# Patient Record
Sex: Female | Born: 1960 | Race: White | Hispanic: No | Marital: Married | Smoking: Former smoker
Health system: Southern US, Community
[De-identification: ages and names within clinical notes are randomized; demographics above are authoritative.]

## PROBLEM LIST (undated history)

## (undated) HISTORY — PX: FRACTURE SURGERY: SHX138

## (undated) HISTORY — PX: SHOULDER SURGERY: SHX246

## (undated) HISTORY — PX: WRIST FRACTURE SURGERY: SHX121

---

## 2020-07-10 ENCOUNTER — Telehealth: Payer: Self-pay

## 2020-07-10 NOTE — Telephone Encounter (Signed)
If her husband is my patient, we can schedule her.

## 2020-07-10 NOTE — Telephone Encounter (Signed)
Please review. New pts are on hold at this time, correct?

## 2020-07-10 NOTE — Telephone Encounter (Signed)
Copied from [place] [phone]. Topic: Appointment Scheduling - Scheduling Inquiry for Clinic >> Jul 10, 2020  3:41 PM [name] wrote: Reason for CRM: Pt was told to call in to sch a NEW PT appt with [facility] . Her husband sees her in the office and was told to just call to sch . Please reach out to pt   Est prim/UMR.callback [phone]/TMMITVI in office or virtual/pec

## 2020-07-15 NOTE — Telephone Encounter (Signed)
Patient's husband see Fabio Bering and she was scheduled for new patient appointment on 08/14/2020 @ 10:00 AM.

## 2020-08-14 ENCOUNTER — Ambulatory Visit: Payer: Self-pay | Admitting: Physician Assistant

## 2020-08-19 NOTE — Progress Notes (Addendum)
New patient visit   Patient: Stacy Wells   DOB: 27-Nov-1961   59 y.o. Female  MRN: [id number] Visit Date: 08/20/2020  Today's healthcare provider: Trinna Post, PA-C   Chief Complaint  Patient presents with  . New Patient (Initial Visit)  . Urinary Frequency   Subjective    Stacy Wells is a 59 y.o. female who presents today as a new patient to establish care. Moved here from [place]. Not currently working.   She reports that she has not had a PCP in several years. Her main complaint today is urinary frequency. She denies hematuria. She reports that she has had symptoms for months. Feels like she constantly has to empty. No stress or urge incontinence. No pelvic pain, vaginal discharge. No fevers, chills, abdominal pain. No history of kidney stones. History of one vaginal birth, ~ 6 lbs.   Colonoscopy ~ 5 years ago in [place] with Maryjane Hurter, she thinks it was normal.  Mammogram: 5 years ago in [place], reported normal.  PAP smear - does not recall   Elevated BP: Patient reports she does not have a history of high BP.   BP Readings from Last 5 Encounters:  08/20/20 (!) 156/72   She does currently vape but does not smoke cigarettes. She is a former smoker.    Constipation: Reports she has been constipated her whole life. May go a week or so without a bowel movement. Reports this will resolve when she drinks magnesium citrate. She denies personal or family history of colon cancer.  History reviewed. No pertinent past medical history. History reviewed. No pertinent surgical history. No family status information on file.   History reviewed. No pertinent family history. Social History   Socioeconomic History  . Marital status: Married    Spouse name: Not on file  . Number of children: Not on file  . Years of education: Not on file  . Highest education level: Not on file  Occupational History  . Not on file  Tobacco Use  . Smoking status: Current Every  Day Smoker  . Smokeless tobacco: Never Used  Substance and Sexual Activity  . Alcohol use: Yes  . Drug use: Never  . Sexual activity: Not on file  Other Topics Concern  . Not on file  Social History Narrative  . Not on file   Social Determinants of Health   Financial Resource Strain:   . Difficulty of Paying Living Expenses: Not on file  Food Insecurity:   . Worried About Charity fundraiser in the Last Year: Not on file  . Ran Out of Food in the Last Year: Not on file  Transportation Needs:   . Lack of Transportation (Medical): Not on file  . Lack of Transportation (Non-Medical): Not on file  Physical Activity:   . Days of Exercise per Week: Not on file  . Minutes of Exercise per Session: Not on file  Stress:   . Feeling of Stress : Not on file  Social Connections:   . Frequency of Communication with Friends and Family: Not on file  . Frequency of Social Gatherings with Friends and Family: Not on file  . Attends Religious Services: Not on file  . Active Member of Clubs or Organizations: Not on file  . Attends Archivist Meetings: Not on file  . Marital Status: Not on file   No outpatient medications prior to visit.   No facility-administered medications prior to visit.   Not on File  Immunization History  Administered Date(s) Administered  . Influenza,inj,Quad PF,6+ Mos 08/20/2020    Health Maintenance  Topic Date Due  . Hepatitis C Screening  Never done  . COVID-19 Vaccine (1) Never done  . HIV Screening  Never done  . TETANUS/TDAP  Never done  . PAP SMEAR-Modifier  Never done  . MAMMOGRAM  Never done  . COLONOSCOPY  Never done  . INFLUENZA VACCINE  Completed    Patient Care Team: Paulene Floor as PCP - General (Physician Assistant)  Review of Systems  Constitutional: Negative.   HENT: Negative.   Eyes: Negative.   Respiratory: Negative.   Cardiovascular: Negative.   Gastrointestinal: Negative.   Endocrine: Negative.     Genitourinary: Positive for frequency. Negative for hematuria.  Musculoskeletal: Negative.   Skin: Negative.   Allergic/Immunologic: Negative.   Neurological: Negative.   Hematological: Negative.   Psychiatric/Behavioral: Negative.       Objective    BP (!) 156/72   Pulse 73   Temp 98.6 F (37 C)   Ht 5\' 6"  (1.676 m)   Wt 108 lb 9.6 oz (49.3 kg)   BMI 17.53 kg/m  Physical Exam Constitutional:      Appearance: Normal appearance.  Cardiovascular:     Rate and Rhythm: Normal rate and regular rhythm.     Heart sounds: Normal heart sounds.  Pulmonary:     Effort: Pulmonary effort is normal.     Breath sounds: Normal breath sounds.  Abdominal:     General: Bowel sounds are normal.     Palpations: Abdomen is soft.  Skin:    General: Skin is warm and dry.  Neurological:     Mental Status: She is alert and oriented to person, place, and time. Mental status is at baseline.  Psychiatric:        Mood and Affect: Mood normal.        Behavior: Behavior normal.      Depression Screen PHQ 2/9 Scores 08/20/2020  PHQ - 2 Score 0   No results found for any visits on 08/20/20.  Assessment & Plan     1. Urinary frequency  Trial of ditropan, counseled on side effects. Urine culture shows E. Coli. Will treat with bactrim DS BID x 3 days.   - Urine Microscopic - CULTURE, URINE COMPREHENSIVE - oxybutynin (DITROPAN XL) 5 MG 24 hr tablet; Take 1 tablet (5 mg total) by mouth at bedtime.  Dispense: 30 tablet; Refill: 2  2. Colon cancer screening  Requesting colonoscopy from Maryjane Hurter in [place].  3. Encounter for screening mammogram for malignant neoplasm of breast  - MM Digital Screening; Future  4. Elevated blood pressure reading  Recheck next visit.   - TSH - Lipid panel - Comprehensive metabolic panel - CBC with Differential/Platelet  5. Encounter for screening for HIV  - HIV Antibody (routine testing w rflx)  6. Encounter for hepatitis C screening test  for low risk patient  - Hepatitis C Antibody  7. Need for influenza vaccination  - Flu Vaccine QUAD 6+ mos PF IM (Fluarix Quad PF)  8. Constipation   Can use miralax daily.   Return in about 4 weeks (around 09/17/2020) for CPE.     ITrinna Post, PA-C, have reviewed all documentation for this visit. The documentation on 08/20/20 for the exam, diagnosis, procedures, and orders are all accurate and complete.  The entirety of the information documented in the History of Present Illness, Review of Systems  and Physical Exam were personally obtained by me. Portions of this information were initially documented by Meyer Cory, CMA and reviewed by me for thoroughness and accuracy.      Paulene Floor  [facility] [phone] (phone) [phone] (fax)  [place]

## 2020-08-20 ENCOUNTER — Encounter: Payer: Self-pay | Admitting: Physician Assistant

## 2020-08-20 ENCOUNTER — Ambulatory Visit (INDEPENDENT_AMBULATORY_CARE_PROVIDER_SITE_OTHER): Payer: Commercial Managed Care - PPO | Admitting: Physician Assistant

## 2020-08-20 ENCOUNTER — Other Ambulatory Visit: Payer: Self-pay

## 2020-08-20 VITALS — BP 156/72 | HR 73 | Temp 98.6°F | Ht 66.0 in | Wt 108.6 lb

## 2020-08-20 DIAGNOSIS — Z23 Encounter for immunization: Secondary | ICD-10-CM | POA: Diagnosis not present

## 2020-08-20 DIAGNOSIS — R350 Frequency of micturition: Secondary | ICD-10-CM | POA: Diagnosis not present

## 2020-08-20 DIAGNOSIS — Z1211 Encounter for screening for malignant neoplasm of colon: Secondary | ICD-10-CM

## 2020-08-20 DIAGNOSIS — R030 Elevated blood-pressure reading, without diagnosis of hypertension: Secondary | ICD-10-CM

## 2020-08-20 DIAGNOSIS — K5900 Constipation, unspecified: Secondary | ICD-10-CM

## 2020-08-20 DIAGNOSIS — Z1231 Encounter for screening mammogram for malignant neoplasm of breast: Secondary | ICD-10-CM

## 2020-08-20 DIAGNOSIS — Z1159 Encounter for screening for other viral diseases: Secondary | ICD-10-CM

## 2020-08-20 DIAGNOSIS — Z114 Encounter for screening for human immunodeficiency virus [HIV]: Secondary | ICD-10-CM

## 2020-08-20 MED ORDER — OXYBUTYNIN CHLORIDE ER 5 MG PO TB24: 5.0000 mg | ORAL_TABLET | Freq: Every day | ORAL | 2 refills | Status: AC

## 2020-08-20 NOTE — Patient Instructions (Signed)
Shingrix - two dose shingles vaccine. Call your insurance and see where they pay for it.

## 2020-08-21 LAB — CBC WITH DIFFERENTIAL/PLATELET
Basophils Absolute: 0 10*3/uL (ref 0.0–0.2)
Basos: 1 %
EOS (ABSOLUTE): 0 10*3/uL (ref 0.0–0.4)
Eos: 0 %
Hematocrit: 35.2 % (ref 34.0–46.6)
Hemoglobin: 12 g/dL (ref 11.1–15.9)
Immature Grans (Abs): 0 10*3/uL (ref 0.0–0.1)
Immature Granulocytes: 0 %
Lymphocytes Absolute: 1.1 10*3/uL (ref 0.7–3.1)
Lymphs: 22 %
MCH: 33.9 pg — ABNORMAL HIGH (ref 26.6–33.0)
MCHC: 34.1 g/dL (ref 31.5–35.7)
MCV: 99 fL — ABNORMAL HIGH (ref 79–97)
Monocytes Absolute: 0.4 10*3/uL (ref 0.1–0.9)
Monocytes: 7 %
Neutrophils Absolute: 3.5 10*3/uL (ref 1.4–7.0)
Neutrophils: 70 %
Platelets: 300 10*3/uL (ref 150–450)
RBC: 3.54 x10E6/uL — ABNORMAL LOW (ref 3.77–5.28)
RDW: 12.3 % (ref 11.7–15.4)
WBC: 5.1 10*3/uL (ref 3.4–10.8)

## 2020-08-21 LAB — COMPREHENSIVE METABOLIC PANEL
ALT: 11 IU/L (ref 0–32)
AST: 15 IU/L (ref 0–40)
Albumin/Globulin Ratio: 1.8 (ref 1.2–2.2)
Albumin: 4.8 g/dL (ref 3.8–4.9)
Alkaline Phosphatase: 70 IU/L (ref 48–121)
BUN/Creatinine Ratio: 19 (ref 12–28)
BUN: 11 mg/dL (ref 8–27)
Bilirubin Total: 0.2 mg/dL (ref 0.0–1.2)
CO2: 24 mmol/L (ref 20–29)
Calcium: 9.8 mg/dL (ref 8.7–10.3)
Chloride: 101 mmol/L (ref 96–106)
Creatinine, Ser: 0.57 mg/dL (ref 0.57–1.00)
GFR calc Af Amer: 117 mL/min/{1.73_m2} (ref >59)
GFR calc non Af Amer: 101 mL/min/{1.73_m2} (ref >59)
Globulin, Total: 2.6 g/dL (ref 1.5–4.5)
Glucose: 87 mg/dL (ref 65–99)
Potassium: 4.2 mmol/L (ref 3.5–5.2)
Sodium: 140 mmol/L (ref 134–144)
Total Protein: 7.4 g/dL (ref 6.0–8.5)

## 2020-08-21 LAB — TSH: TSH: 1.34 u[IU]/mL (ref 0.450–4.500)

## 2020-08-21 LAB — LIPID PANEL
Chol/HDL Ratio: 3.3 ratio (ref 0.0–4.4)
Cholesterol, Total: 273 mg/dL — ABNORMAL HIGH (ref 100–199)
HDL: 83 mg/dL (ref >39)
LDL Chol Calc (NIH): 173 mg/dL — ABNORMAL HIGH (ref 0–99)
Triglycerides: 101 mg/dL (ref 0–149)
VLDL Cholesterol Cal: 17 mg/dL (ref 5–40)

## 2020-08-21 LAB — URINALYSIS, MICROSCOPIC ONLY
Casts: NONE SEEN /lpf
Epithelial Cells (non renal): NONE SEEN /hpf (ref 0–10)
RBC, Urine: NONE SEEN /hpf (ref 0–2)
WBC, UA: NONE SEEN /hpf (ref 0–5)

## 2020-08-21 LAB — HIV ANTIBODY (ROUTINE TESTING W REFLEX): HIV Screen 4th Generation wRfx: NONREACTIVE

## 2020-08-21 LAB — HEPATITIS C ANTIBODY: Hep C Virus Ab: <0.1 s/co ratio (ref 0.0–0.9)

## 2020-08-23 ENCOUNTER — Telehealth: Payer: Self-pay

## 2020-08-23 DIAGNOSIS — N3090 Cystitis, unspecified without hematuria: Secondary | ICD-10-CM

## 2020-08-23 LAB — CULTURE, URINE COMPREHENSIVE

## 2020-08-23 NOTE — Telephone Encounter (Signed)
Called patient and no answer left voicemail message for patient to return call. If patient calls back okay for PEC to advise of message.

## 2020-08-23 NOTE — Telephone Encounter (Signed)
-----   Message from Trinna Post, [place] sent at 08/23/2020  2:10 PM EDT ----- Urine culture shows e coli. This is a urinary tract infection that can contribute to urinary frequency. I would recommend bactrim DS twice daily for three days. Can we ask patient which pharmacy she needs it sent to because I believe she is out of town. OK to send to pharmacy of her choice.

## 2020-08-28 MED ORDER — SULFAMETHOXAZOLE-TRIMETHOPRIM 800-160 MG PO TABS: 1.0000 | ORAL_TABLET | Freq: Two times a day (BID) | ORAL | 0 refills | Status: DC

## 2020-08-28 NOTE — Telephone Encounter (Signed)
Patient was advised and report she still has the urinating frequently. Patient states the Bactrim can be send to pharmacy in chart.

## 2020-08-28 NOTE — Telephone Encounter (Signed)
Sent her in some bactrim to local pharmacy.

## 2020-08-29 MED ORDER — SULFAMETHOXAZOLE-TRIMETHOPRIM 800-160 MG PO TABS: 1.0000 | ORAL_TABLET | Freq: Two times a day (BID) | ORAL | 0 refills | Status: DC

## 2020-08-29 MED ORDER — HYDROXYZINE HCL 10 MG PO TABS: 10.0000 mg | ORAL_TABLET | Freq: Three times a day (TID) | ORAL | 0 refills | Status: DC | PRN

## 2020-08-29 MED ORDER — SULFAMETHOXAZOLE-TRIMETHOPRIM 800-160 MG PO TABS: 1.0000 | ORAL_TABLET | Freq: Two times a day (BID) | ORAL | 0 refills | Status: AC

## 2020-08-29 NOTE — Telephone Encounter (Signed)
Patient called to inform the doctor that she is in [place] and she would like the medication for antibiotic sent to a pharmacy there.  She will not be back in town until next week.  Please call patient to discuss at [phone]

## 2020-08-29 NOTE — Telephone Encounter (Signed)
Spoke to patient and she is requesting abx be sent into [facility] in [place], [place]. Thanks!

## 2020-08-29 NOTE — Addendum Note (Signed)
Addended by: Trinna Post on: 08/29/2020 04:06 PM   Modules accepted: Orders

## 2020-08-29 NOTE — Telephone Encounter (Signed)
Patient was advised and said "thank you so much" 

## 2020-08-29 NOTE — Telephone Encounter (Signed)
Sent in Bactrim to her [facility]. Ignore the hydroxyzine, I sent it in by accident. Can we call the pharmacy and cancel subscription?

## 2020-09-17 ENCOUNTER — Other Ambulatory Visit: Payer: Self-pay

## 2020-09-17 ENCOUNTER — Ambulatory Visit
Admission: RE | Admit: 2020-09-17 | Discharge: 2020-09-17 | Disposition: A | Discharge status: Home / Self Care | Payer: Commercial Managed Care - PPO | Source: Ambulatory Visit | Attending: Physician Assistant | Admitting: Physician Assistant

## 2020-09-17 DIAGNOSIS — Z1231 Encounter for screening mammogram for malignant neoplasm of breast: Secondary | ICD-10-CM | POA: Diagnosis present

## 2020-10-08 ENCOUNTER — Ambulatory Visit (INDEPENDENT_AMBULATORY_CARE_PROVIDER_SITE_OTHER): Payer: Commercial Managed Care - PPO | Admitting: Physician Assistant

## 2020-10-08 ENCOUNTER — Other Ambulatory Visit: Payer: Self-pay

## 2020-10-08 ENCOUNTER — Encounter: Payer: Self-pay | Admitting: Physician Assistant

## 2020-10-08 ENCOUNTER — Other Ambulatory Visit (HOSPITAL_COMMUNITY)
Admission: RE | Admit: 2020-10-08 | Discharge: 2020-10-08 | Disposition: A | Discharge status: Home / Self Care | Payer: Commercial Managed Care - PPO | Source: Ambulatory Visit | Attending: Physician Assistant | Admitting: Physician Assistant

## 2020-10-08 VITALS — BP 134/70 | HR 75 | Temp 98.1°F | Ht 66.0 in | Wt 107.0 lb

## 2020-10-08 DIAGNOSIS — N3281 Overactive bladder: Secondary | ICD-10-CM

## 2020-10-08 DIAGNOSIS — Z0000 Encounter for general adult medical examination without abnormal findings: Secondary | ICD-10-CM

## 2020-10-08 DIAGNOSIS — E785 Hyperlipidemia, unspecified: Secondary | ICD-10-CM

## 2020-10-08 DIAGNOSIS — Z86010 Personal history of colonic polyps: Secondary | ICD-10-CM

## 2020-10-08 DIAGNOSIS — Z1211 Encounter for screening for malignant neoplasm of colon: Secondary | ICD-10-CM

## 2020-10-08 DIAGNOSIS — Z124 Encounter for screening for malignant neoplasm of cervix: Secondary | ICD-10-CM | POA: Diagnosis not present

## 2020-10-08 DIAGNOSIS — Z23 Encounter for immunization: Secondary | ICD-10-CM

## 2020-10-08 MED ORDER — ROSUVASTATIN CALCIUM 5 MG PO TABS: 5.0000 mg | ORAL_TABLET | Freq: Every day | ORAL | 1 refills | Status: DC

## 2020-10-08 MED ORDER — OXYBUTYNIN CHLORIDE ER 5 MG PO TB24: 5.0000 mg | ORAL_TABLET | Freq: Every day | ORAL | 0 refills | Status: DC

## 2020-10-08 NOTE — Progress Notes (Signed)
Complete physical exam   Patient: Stacy Wells   DOB: February 21, 1961   59 y.o. Female  MRN: [id number] Visit Date: 10/08/2020  Today's healthcare provider: Trinna Post, PA-C   Chief Complaint  Patient presents with  . Annual Exam   Subjective    Stacy Wells is a 59 y.o. female who presents today for a complete physical exam.  She reports consuming a general diet. Home exercise routine includes walking. She generally feels well. She reports sleeping well. She does not have additional problems to discuss today.  HPI   Lipid/Cholesterol, Follow-up  Last lipid panel Other pertinent labs  Lab Results  Component Value Date   CHOL 273 (H) 08/20/2020   HDL 83 08/20/2020   LDLCALC 173 (H) 08/20/2020   TRIG 101 08/20/2020   CHOLHDL 3.3 08/20/2020   Lab Results  Component Value Date   ALT 11 08/20/2020   AST 15 08/20/2020   PLT 300 08/20/2020   TSH 1.340 08/20/2020     She was last seen for this 1 months ago.  Management since that visit includes checking labs.  Symptoms: No chest pain No chest pressure/discomfort  No dyspnea No lower extremity edema  No numbness or tingling of extremity No orthopnea  No palpitations No paroxysmal nocturnal dyspnea  No speech difficulty No syncope   Current diet: not asked Current exercise: aerobics  The 10-year ASCVD risk score Mikey Bussing DC Jr., et al., 2013) is: 7.9%  ---------------------------------------------------------------------------------------------------  History reviewed. No pertinent past medical history. History reviewed. No pertinent surgical history. Social History   Socioeconomic History  . Marital status: Married    Spouse name: Not on file  . Number of children: Not on file  . Years of education: Not on file  . Highest education level: Not on file  Occupational History  . Not on file  Tobacco Use  . Smoking status: Current Every Day Smoker  . Smokeless tobacco: Never Used  Substance and  Sexual Activity  . Alcohol use: Yes  . Drug use: Never  . Sexual activity: Not on file  Other Topics Concern  . Not on file  Social History Narrative  . Not on file   Social Determinants of Health   Financial Resource Strain:   . Difficulty of Paying Living Expenses: Not on file  Food Insecurity:   . Worried About Charity fundraiser in the Last Year: Not on file  . Ran Out of Food in the Last Year: Not on file  Transportation Needs:   . Lack of Transportation (Medical): Not on file  . Lack of Transportation (Non-Medical): Not on file  Physical Activity:   . Days of Exercise per Week: Not on file  . Minutes of Exercise per Session: Not on file  Stress:   . Feeling of Stress : Not on file  Social Connections:   . Frequency of Communication with Friends and Family: Not on file  . Frequency of Social Gatherings with Friends and Family: Not on file  . Attends Religious Services: Not on file  . Active Member of Clubs or Organizations: Not on file  . Attends Archivist Meetings: Not on file  . Marital Status: Not on file  Intimate Partner Violence:   . Fear of Current or Ex-Partner: Not on file  . Emotionally Abused: Not on file  . Physically Abused: Not on file  . Sexually Abused: Not on file   No family status information on file.  History reviewed. No pertinent family history. Allergies  Allergen Reactions  . Codeine Rash    Patient Care Team: Paulene Floor as PCP - General (Physician Assistant)   Medications: No outpatient medications prior to visit.   No facility-administered medications prior to visit.    Review of Systems  Constitutional: Negative.   HENT: Negative.   Eyes: Negative.   Respiratory: Negative.   Cardiovascular: Negative.   Gastrointestinal: Negative.   Endocrine: Negative.   Genitourinary: Positive for frequency.  Musculoskeletal: Negative.   Skin: Negative.   Allergic/Immunologic: Negative.   Neurological:  Negative.   Hematological: Negative.   Psychiatric/Behavioral: Negative.       Objective    BP 134/70 (BP Location: Left Arm, Patient Position: Sitting, Cuff Size: Normal)   Pulse 75   Temp 98.1 F (36.7 C) (Oral)   Ht 5\' 6"  (1.676 m)   Wt 107 lb (48.5 kg)   SpO2 98%   BMI 17.27 kg/m    Physical Exam Exam conducted with a chaperone present.  Constitutional:      Appearance: Normal appearance.  HENT:     Right Ear: Tympanic membrane and ear canal normal.     Left Ear: Tympanic membrane and ear canal normal.  Cardiovascular:     Rate and Rhythm: Normal rate and regular rhythm.     Heart sounds: Normal heart sounds.  Pulmonary:     Effort: Pulmonary effort is normal.     Breath sounds: Normal breath sounds.  Abdominal:     General: Bowel sounds are normal.     Palpations: Abdomen is soft.  Genitourinary:    Vagina: Normal.     Cervix: Normal.  Musculoskeletal:     Cervical back: Normal range of motion and neck supple.  Lymphadenopathy:     Cervical: No cervical adenopathy.  Skin:    General: Skin is warm and dry.  Neurological:     Mental Status: She is alert and oriented to person, place, and time. Mental status is at baseline.  Psychiatric:        Mood and Affect: Mood normal.        Behavior: Behavior normal.       Last depression screening scores PHQ 2/9 Scores 10/08/2020 08/20/2020  PHQ - 2 Score 0 0   Last fall risk screening Fall Risk  10/08/2020  Falls in the past year? 0  Number falls in past yr: 0  Injury with Fall? 0   Last Audit-C alcohol use screening Alcohol Use Disorder Test (AUDIT) 08/20/2020  1. How often do you have a drink containing alcohol? 4  2. How many drinks containing alcohol do you have on a typical day when you are drinking? 1  3. How often do you have six or more drinks on one occasion? 2  AUDIT-C Score 7  4. How often during the last year have you found that you were not able to stop drinking once you had started? 0  5.  How often during the last year have you failed to do what was normally expected from you because of drinking? 0  6. How often during the last year have you needed a first drink in the morning to get yourself going after a heavy drinking session? 0  7. How often during the last year have you had a feeling of guilt of remorse after drinking? 0  8. How often during the last year have you been unable to remember what happened the night before  because you had been drinking? 0  9. Have you or someone else been injured as a result of your drinking? 0  10. Has a relative or friend or a doctor or another health worker been concerned about your drinking or suggested you cut down? 0  Alcohol Use Disorder Identification Test Final Score (AUDIT) 7  Alcohol Brief Interventions/Follow-up AUDIT Score <7 follow-up not indicated   A score of 3 or more in women, and 4 or more in men indicates increased risk for alcohol abuse, EXCEPT if all of the points are from question 1   Results for orders placed or performed in visit on 10/08/20  Cytology - PAP  Result Value Ref Range   High risk HPV Negative    Adequacy      Satisfactory for evaluation; transformation zone component PRESENT.   Diagnosis      - Negative for intraepithelial lesion or malignancy (NILM)   Comment Normal Reference Range HPV - Negative     Assessment & Plan    Routine Health Maintenance and Physical Exam  Exercise Activities and Dietary recommendations Goals   None     Immunization History  Administered Date(s) Administered  . Influenza,inj,Quad PF,6+ Mos 08/20/2020  . PFIZER SARS-COV-2 Vaccination 03/20/2020, 04/10/2020  . Tdap 10/08/2020  . Zoster Recombinat (Shingrix) 10/08/2020    Health Maintenance  Topic Date Due  . COLONOSCOPY  Never done  . MAMMOGRAM  09/17/2022  . PAP SMEAR-Modifier  10/09/2023  . TETANUS/TDAP  10/08/2030  . INFLUENZA VACCINE  Completed  . COVID-19 Vaccine  Completed  . Hepatitis C Screening   Completed  . HIV Screening  Completed    Discussed health benefits of physical activity, and encouraged her to engage in regular exercise appropriate for her age and condition.   1. Annual physical exam  She is going to be without insurance at some point during the next year and is considering moving back to [place]. Counseled on our self pay discount and/or community resources including free and low cost clinics. Advised she should be seen at least 1x per year for routine follow up.  2. Cervical cancer screening  - Cytology - PAP  3. Need for shingles vaccine  - Varicella-zoster vaccine IM (Shingrix)  4. Need for tetanus booster  - Tdap vaccine greater than or equal to 7yo IM  5. OAB (overactive bladder)  She did have treatment for UTI but her symptoms have not resolved. Trial as below.  - oxybutynin (DITROPAN XL) 5 MG 24 hr tablet; Take 1 tablet (5 mg total) by mouth at bedtime.  Dispense: 90 tablet; Refill: 0  6. Colon cancer screening  - Ambulatory referral to Gastroenterology  7. Hyperlipidemia, unspecified hyperlipidemia type  Start statin for elevated CVD risk. F/u 6 weeks. - rosuvastatin (CRESTOR) 5 MG tablet; Take 1 tablet (5 mg total) by mouth daily.  Dispense: 90 tablet; Refill: 1  8. History of colon polyps  - Ambulatory referral to Gastroenterology   No follow-ups on file.     .providerayy  The entirety of the information documented in the History of Present Illness, Review of Systems and Physical Exam were personally obtained by me. Portions of this information were initially documented by Idelle Jo, CMA and reviewed by me for thoroughness and accuracy.        Paulene Floor  [facility] [phone] (phone) [phone] (fax)  [place]

## 2020-10-08 NOTE — Patient Instructions (Addendum)
Take allegra/zyrtec/claritin or xyzal daily Add flonase 2 sprays each nostril daily if not helping    Health Maintenance, Female Adopting a healthy lifestyle and getting preventive care are important in promoting health and wellness. Ask your health care provider about:  The right schedule for you to have regular tests and exams.  Things you can do on your own to prevent diseases and keep yourself healthy. What should I know about diet, weight, and exercise? Eat a healthy diet   Eat a diet that includes plenty of vegetables, fruits, low-fat dairy products, and lean protein.  Do not eat a lot of foods that are high in solid fats, added sugars, or sodium. Maintain a healthy weight Body mass index (BMI) is used to identify weight problems. It estimates body fat based on height and weight. Your health care provider can help determine your BMI and help you achieve or maintain a healthy weight. Get regular exercise Get regular exercise. This is one of the most important things you can do for your health. Most adults should:  Exercise for at least 150 minutes each week. The exercise should increase your heart rate and make you sweat (moderate-intensity exercise).  Do strengthening exercises at least twice a week. This is in addition to the moderate-intensity exercise.  Spend less time sitting. Even light physical activity can be beneficial. Watch cholesterol and blood lipids Have your blood tested for lipids and cholesterol at 61 years of age, then have this test every 5 years. Have your cholesterol levels checked more often if:  Your lipid or cholesterol levels are high.  You are older than 61 years of age.  You are at high risk for heart disease. What should I know about cancer screening? Depending on your health history and family history, you may need to have cancer screening at various ages. This may include screening for:  Breast cancer.  Cervical cancer.  Colorectal  cancer.  Skin cancer.  Lung cancer. What should I know about heart disease, diabetes, and high blood pressure? Blood pressure and heart disease  High blood pressure causes heart disease and increases the risk of stroke. This is more likely to develop in people who have high blood pressure readings, are of African descent, or are overweight.  Have your blood pressure checked: ? Every 3-5 years if you are 70-10 years of age. ? Every year if you are 3 years old or older. Diabetes Have regular diabetes screenings. This checks your fasting blood sugar level. Have the screening done:  Once every three years after age 27 if you are at a normal weight and have a low risk for diabetes.  More often and at a younger age if you are overweight or have a high risk for diabetes. What should I know about preventing infection? Hepatitis B If you have a higher risk for hepatitis B, you should be screened for this virus. Talk with your health care provider to find out if you are at risk for hepatitis B infection. Hepatitis C Testing is recommended for:  Everyone born from [birth year] through 1967.  Anyone with known risk factors for hepatitis C. Sexually transmitted infections (STIs)  Get screened for STIs, including gonorrhea and chlamydia, if: ? You are sexually active and are younger than 61 years of age. ? You are older than 61 years of age and your health care provider tells you that you are at risk for this type of infection. ? Your sexual activity has changed since you were  last screened, and you are at increased risk for chlamydia or gonorrhea. Ask your health care provider if you are at risk.  Ask your health care provider about whether you are at high risk for HIV. Your health care provider may recommend a prescription medicine to help prevent HIV infection. If you choose to take medicine to prevent HIV, you should first get tested for HIV. You should then be tested every 3 months for as long as  you are taking the medicine. Pregnancy  If you are about to stop having your period (premenopausal) and you may become pregnant, seek counseling before you get pregnant.  Take 400 to 800 micrograms (mcg) of folic acid every day if you become pregnant.  Ask for birth control (contraception) if you want to prevent pregnancy. Osteoporosis and menopause Osteoporosis is a disease in which the bones lose minerals and strength with aging. This can result in bone fractures. If you are 57 years old or older, or if you are at risk for osteoporosis and fractures, ask your health care provider if you should:  Be screened for bone loss.  Take a calcium or vitamin D supplement to lower your risk of fractures.  Be given hormone replacement therapy (HRT) to treat symptoms of menopause. Follow these instructions at home: Lifestyle  Do not use any products that contain nicotine or tobacco, such as cigarettes, e-cigarettes, and chewing tobacco. If you need help quitting, ask your health care provider.  Do not use street drugs.  Do not share needles.  Ask your health care provider for help if you need support or information about quitting drugs. Alcohol use  Do not drink alcohol if: ? Your health care provider tells you not to drink. ? You are pregnant, may be pregnant, or are planning to become pregnant.  If you drink alcohol: ? Limit how much you use to 0-1 drink a day. ? Limit intake if you are breastfeeding.  Be aware of how much alcohol is in your drink. In the [place] one drink equals one 12 oz bottle of beer (355 mL), one 5 oz glass of wine (148 mL), or one 1 oz glass of hard liquor (44 mL). General instructions  Schedule regular health, dental, and eye exams.  Stay current with your vaccines.  Tell your health care provider if: ? You often feel depressed. ? You have ever been abused or do not feel safe at home. Summary  Adopting a healthy lifestyle and getting preventive care are  important in promoting health and wellness.  Follow your health care provider's instructions about healthy diet, exercising, and getting tested or screened for diseases.  Follow your health care provider's instructions on monitoring your cholesterol and blood pressure. This information is not intended to replace advice given to you by your health care provider. Make sure you discuss any questions you have with your health care provider. Document Revised: 11/30/2018 Document Reviewed: 11/30/2018 Elsevier Patient Education  2020 [employer].

## 2020-10-09 ENCOUNTER — Encounter: Payer: Self-pay | Admitting: Physician Assistant

## 2020-10-10 LAB — CYTOLOGY - PAP
Comment: NEGATIVE
Diagnosis: NEGATIVE
High risk HPV: NEGATIVE

## 2020-10-17 ENCOUNTER — Telehealth (INDEPENDENT_AMBULATORY_CARE_PROVIDER_SITE_OTHER): Payer: Self-pay | Admitting: Gastroenterology

## 2020-10-17 DIAGNOSIS — Z86010 Personal history of colonic polyps: Secondary | ICD-10-CM

## 2020-10-17 DIAGNOSIS — Z1211 Encounter for screening for malignant neoplasm of colon: Secondary | ICD-10-CM

## 2020-10-17 MED ORDER — NA SULFATE-K SULFATE-MG SULF 17.5-3.13-1.6 GM/177ML PO SOLN: 1.0000 | Freq: Once | ORAL | 0 refills | Status: AC

## 2020-10-17 NOTE — Progress Notes (Signed)
Gastroenterology Pre-Procedure Review  Request Date: Tuesday 10/22/20 Requesting Physician: Dr. Vicente Males  PATIENT REVIEW QUESTIONS: The patient responded to the following health history questions as indicated:    1. Are you having any GI issues? no 2. Do you have a personal history of Polyps? yes (doesn't recall the year) 3. Do you have a family history of Colon Cancer or Polyps? no 4. Diabetes Mellitus? no 5. Joint replacements in the past 12 months?no 6. Major health problems in the past 3 months?no 7. Any artificial heart valves, MVP, or defibrillator?no    MEDICATIONS & ALLERGIES:    Patient reports the following regarding taking any anticoagulation/antiplatelet therapy:   Plavix, Coumadin, Eliquis, Xarelto, Lovenox, Pradaxa, Brilinta, or Effient? no Aspirin? no  Patient confirms/reports the following medications:  Current Outpatient Medications  Medication Sig Dispense Refill  . oxybutynin (DITROPAN XL) 5 MG 24 hr tablet Take 1 tablet (5 mg total) by mouth at bedtime. 90 tablet 0  . rosuvastatin (CRESTOR) 5 MG tablet Take 1 tablet (5 mg total) by mouth daily. 90 tablet 1   No current facility-administered medications for this visit.    Patient confirms/reports the following allergies:  Allergies  Allergen Reactions  . Codeine Rash    No orders of the defined types were placed in this encounter.   AUTHORIZATION INFORMATION Primary Insurance: 1D#: Group #:  Secondary Insurance: 1D#: Group #:  SCHEDULE INFORMATION: Date: 10/22/20 Time: Location:[facility]

## 2020-10-18 ENCOUNTER — Other Ambulatory Visit
Admission: RE | Admit: 2020-10-18 | Discharge: 2020-10-18 | Disposition: A | Discharge status: Home / Self Care | Payer: Commercial Managed Care - PPO | Source: Ambulatory Visit | Attending: Gastroenterology | Admitting: Gastroenterology

## 2020-10-18 ENCOUNTER — Other Ambulatory Visit: Payer: Self-pay

## 2020-10-18 DIAGNOSIS — Z01812 Encounter for preprocedural laboratory examination: Secondary | ICD-10-CM | POA: Diagnosis present

## 2020-10-18 DIAGNOSIS — Z20822 Contact with and (suspected) exposure to covid-19: Secondary | ICD-10-CM | POA: Insufficient documentation

## 2020-10-19 LAB — SARS CORONAVIRUS 2 (TAT 6-24 HRS): SARS Coronavirus 2: NEGATIVE

## 2020-10-22 ENCOUNTER — Encounter
Admission: RE | Disposition: A | Discharge status: Home / Self Care | Payer: Self-pay | Source: Home / Self Care | Attending: Gastroenterology

## 2020-10-22 ENCOUNTER — Ambulatory Visit
Admission: RE | Admit: 2020-10-22 | Discharge: 2020-10-22 | Disposition: A | Discharge status: Home / Self Care | Payer: Commercial Managed Care - PPO | Attending: Gastroenterology | Admitting: Gastroenterology

## 2020-10-22 ENCOUNTER — Ambulatory Visit: Payer: Commercial Managed Care - PPO | Admitting: Certified Registered"

## 2020-10-22 ENCOUNTER — Encounter: Payer: Self-pay | Admitting: Gastroenterology

## 2020-10-22 ENCOUNTER — Other Ambulatory Visit: Payer: Self-pay

## 2020-10-22 DIAGNOSIS — Z1211 Encounter for screening for malignant neoplasm of colon: Secondary | ICD-10-CM | POA: Insufficient documentation

## 2020-10-22 DIAGNOSIS — D122 Benign neoplasm of ascending colon: Secondary | ICD-10-CM | POA: Diagnosis not present

## 2020-10-22 DIAGNOSIS — Z87891 Personal history of nicotine dependence: Secondary | ICD-10-CM | POA: Insufficient documentation

## 2020-10-22 DIAGNOSIS — Z86010 Personal history of colonic polyps: Secondary | ICD-10-CM | POA: Insufficient documentation

## 2020-10-22 DIAGNOSIS — D124 Benign neoplasm of descending colon: Secondary | ICD-10-CM | POA: Insufficient documentation

## 2020-10-22 DIAGNOSIS — K635 Polyp of colon: Secondary | ICD-10-CM

## 2020-10-22 DIAGNOSIS — Z79899 Other long term (current) drug therapy: Secondary | ICD-10-CM | POA: Diagnosis not present

## 2020-10-22 HISTORY — PX: COLONOSCOPY WITH PROPOFOL: SHX5780

## 2020-10-22 SURGERY — COLONOSCOPY WITH PROPOFOL
Anesthesia: General

## 2020-10-22 MED ORDER — SODIUM CHLORIDE 0.9 % IV SOLN
INTRAVENOUS | Status: DC
  Administered 2020-10-22: 1000 mL via INTRAVENOUS

## 2020-10-22 MED ORDER — PROPOFOL 10 MG/ML IV BOLUS
INTRAVENOUS | Status: DC | PRN
  Administered 2020-10-22: 20 mg via INTRAVENOUS
  Administered 2020-10-22: 60 mg via INTRAVENOUS
  Administered 2020-10-22: 20 mg via INTRAVENOUS

## 2020-10-22 MED ORDER — PHENYLEPHRINE HCL (PRESSORS) 10 MG/ML IV SOLN
INTRAVENOUS | Status: DC | PRN
  Administered 2020-10-22: 100 ug via INTRAVENOUS

## 2020-10-22 MED ORDER — PROPOFOL 500 MG/50ML IV EMUL
INTRAVENOUS | Status: DC | PRN
  Administered 2020-10-22: 125 ug/kg/min via INTRAVENOUS

## 2020-10-22 MED ORDER — PROPOFOL 500 MG/50ML IV EMUL
INTRAVENOUS | Status: AC
  Filled 2020-10-22: qty 50

## 2020-10-22 NOTE — Transfer of Care (Signed)
Immediate Anesthesia Transfer of Care Note  Patient: Stacy Wells  Procedure(s) Performed: COLONOSCOPY WITH PROPOFOL (N/A )  Patient Location: PACU and Endoscopy Unit  Anesthesia Type:General  Level of Consciousness: awake and drowsy  Airway & Oxygen Therapy: Patient Spontanous Breathing  Post-op Assessment: Report given to RN and Post -op Vital signs reviewed and stable  Post vital signs: Reviewed and stable  Last Vitals:  Vitals Value Taken Time  BP 96/51 10/22/20 1036  Temp    Pulse 85 10/22/20 1037  Resp 15 10/22/20 1037  SpO2 94 % 10/22/20 1037  Vitals shown include unvalidated device data.  Last Pain:  Vitals:   10/22/20 0933  TempSrc: Temporal  PainSc: 0-No pain         Complications: No complications documented.

## 2020-10-22 NOTE — Anesthesia Procedure Notes (Signed)
Date/Time: 10/22/2020 10:02 AM Performed by: Jerrye Noble, CRNA Pre-anesthesia Checklist: Patient identified, Emergency Drugs available, Suction available and Patient being monitored Oxygen Delivery Method: Nasal cannula

## 2020-10-22 NOTE — Anesthesia Postprocedure Evaluation (Signed)
Anesthesia Post Note  Patient: Stacy Wells  Procedure(s) Performed: COLONOSCOPY WITH PROPOFOL (N/A )  Patient location during evaluation: Endoscopy Anesthesia Type: General Level of consciousness: awake and alert Pain management: pain level controlled Vital Signs Assessment: post-procedure vital signs reviewed and stable Respiratory status: spontaneous breathing, nonlabored ventilation, respiratory function stable and patient connected to nasal cannula oxygen Cardiovascular status: blood pressure returned to baseline and stable Postop Assessment: no apparent nausea or vomiting Anesthetic complications: no   No complications documented.   Last Vitals:  Vitals:   10/22/20 0933  BP: (!) 124/59  Pulse: 75  Resp: 16  Temp: 36.8 C  SpO2: 98%    Last Pain:  Vitals:   10/22/20 0933  TempSrc: Temporal  PainSc: 0-No pain                 Arita Miss

## 2020-10-22 NOTE — Op Note (Signed)
[facility] Gastroenterology Patient Name: Stacy Wells Procedure Date: 10/22/2020 9:49 AM MRN: [id number] Account #: [id number] Date of Birth: 08-21-1961 Admit Type: Outpatient Age: 59 Room: [facility] ENDO ROOM 2 Gender: Female Note Status: Finalized Procedure:             Colonoscopy Indications:           High risk colon cancer surveillance: Personal history                         of colonic polyps Providers:             Jonathon Bellows MD, MD Referring MD:          Wendee Beavers. Terrilee Croak (Referring MD) Medicines:             Monitored Anesthesia Care Complications:         No immediate complications. Procedure:             Pre-Anesthesia Assessment:                        - Prior to the procedure, a History and Physical was                         performed, and patient medications, allergies and                         sensitivities were reviewed. The patient's tolerance                         of previous anesthesia was reviewed.                        - The risks and benefits of the procedure and the                         sedation options and risks were discussed with the                         patient. All questions were answered and informed                         consent was obtained.                        - ASA Grade Assessment: II - A patient with mild                         systemic disease.                        After obtaining informed consent, the colonoscope was                         passed under direct vision. Throughout the procedure,                         the patient's blood pressure, pulse, and oxygen                         saturations were monitored continuously. The  Colonoscope was introduced through the anus and                         advanced to the the cecum, identified by the                         appendiceal orifice. The colonoscopy was performed                         with ease. The patient tolerated the  procedure well.                         The quality of the bowel preparation was excellent. Findings:      The perianal and digital rectal examinations were normal.      Two sessile polyps were found in the ascending colon. The polyps were 3       to 4 mm in size. These polyps were removed with a jumbo cold forceps.       Resection and retrieval were complete.      Two sessile polyps were found in the descending colon. The polyps were 3       to 4 mm in size. These polyps were removed with a jumbo cold forceps.       Resection and retrieval were complete.      The exam was otherwise without abnormality on direct and retroflexion       views. Impression:            - Two 3 to 4 mm polyps in the ascending colon, removed                         with a jumbo cold forceps. Resected and retrieved.                        - Two 3 to 4 mm polyps in the descending colon,                         removed with a jumbo cold forceps. Resected and                         retrieved.                        - The examination was otherwise normal on direct and                         retroflexion views. Recommendation:        - Discharge patient to home (with escort).                        - Resume previous diet.                        - Continue present medications.                        - Await pathology results.                        - Repeat colonoscopy for surveillance based on  pathology results. Procedure Code(s):     --- Professional ---                        [id number], Colonoscopy, flexible; with biopsy, single or                         multiple Diagnosis Code(s):     --- Professional ---                        Z86.010, Personal history of colonic polyps                        K63.5, Polyp of colon CPT copyright 2019 American Medical Association. All rights reserved. The codes documented in this report are preliminary and upon coder review may  be revised to meet current  compliance requirements. Jonathon Bellows, MD Jonathon Bellows MD, MD 10/22/2020 10:37:30 AM This report has been signed electronically. Number of Addenda: 0 Note Initiated On: 10/22/2020 9:49 AM Scope Withdrawal Time: 0 hours 17 minutes 15 seconds  Total Procedure Duration: 0 hours 27 minutes 31 seconds  Estimated Blood Loss:  Estimated blood loss: none.      [facility]

## 2020-10-22 NOTE — Anesthesia Preprocedure Evaluation (Signed)
Anesthesia Evaluation  Patient identified by MRN, date of birth, ID band Patient awake    Reviewed: Allergy & Precautions, NPO status , Patient's Chart, lab work & pertinent test results  History of Anesthesia Complications Negative for: history of anesthetic complications  Airway Mallampati: II  TM Distance: >3 FB Neck ROM: Full    Dental  (+) Upper Dentures, Lower Dentures   Pulmonary neg pulmonary ROS, neg sleep apnea, neg COPD, Patient abstained from smoking.Not current smoker, former smoker,    Pulmonary exam normal breath sounds clear to auscultation       Cardiovascular Exercise Tolerance: Good METS(-) hypertension(-) CAD and (-) Past MI negative cardio ROS  (-) dysrhythmias  Rhythm:Regular Rate:Normal - Systolic murmurs    Neuro/Psych negative neurological ROS  negative psych ROS   GI/Hepatic neg GERD  ,(+)     (-) substance abuse  ,   Endo/Other  neg diabetes  Renal/GU negative Renal ROS     Musculoskeletal   Abdominal   Peds  Hematology   Anesthesia Other Findings History reviewed. No pertinent past medical history.  Reproductive/Obstetrics                             Anesthesia Physical Anesthesia Plan  ASA: I  Anesthesia Plan: General   Post-op Pain Management:    Induction: Intravenous  PONV Risk Score and Plan: 3 and Ondansetron, Propofol infusion and TIVA  Airway Management Planned: Nasal Cannula  Additional Equipment: None  Intra-op Plan:   Post-operative Plan:   Informed Consent: I have reviewed the patients History and Physical, chart, labs and discussed the procedure including the risks, benefits and alternatives for the proposed anesthesia with the patient or authorized representative who has indicated his/her understanding and acceptance.     Dental advisory given  Plan Discussed with: CRNA and Surgeon  Anesthesia Plan Comments: (Discussed  risks of anesthesia with patient, including possibility of difficulty with spontaneous ventilation under anesthesia necessitating airway intervention, PONV, and rare risks such as cardiac or respiratory or neurological events. Patient understands.)        Anesthesia Quick Evaluation

## 2020-10-22 NOTE — H&P (Signed)
Jonathon Bellows, MD [address] [place], [place], [place], [address] [address], [place], [place], [place], [address] Phone: [phone]  Fax: [phone]  Primary Care Physician:  Paulene Floor   Pre-Procedure History & Physical: HPI:  Stacy Wells is a 59 y.o. female is here for an colonoscopy.   History reviewed. No pertinent past medical history.  Past Surgical History:  Procedure Laterality Date  . FRACTURE SURGERY    . SHOULDER SURGERY Right   . WRIST FRACTURE SURGERY Left     Prior to Admission medications   Medication Sig Start Date End Date Taking? Authorizing Provider  oxybutynin (DITROPAN XL) 5 MG 24 hr tablet Take 1 tablet (5 mg total) by mouth at bedtime. 10/08/20  Yes Carles Collet M, PA-C  rosuvastatin (CRESTOR) 5 MG tablet Take 1 tablet (5 mg total) by mouth daily. 10/08/20  Yes Trinna Post, PA-C    Allergies as of 10/17/2020 - Review Complete 10/17/2020  Allergen Reaction Noted  . Codeine Rash 02/14/2018    History reviewed. No pertinent family history.  Social History   Socioeconomic History  . Marital status: Married    Spouse name: Not on file  . Number of children: Not on file  . Years of education: Not on file  . Highest education level: Not on file  Occupational History  . Not on file  Tobacco Use  . Smoking status: Former Smoker    Types: Cigarettes    Quit date: 10/22/2012    Years since quitting: 8.0  . Smokeless tobacco: Never Used  Vaping Use  . Vaping Use: Every day  . Start date: 02/20/2020  Substance and Sexual Activity  . Alcohol use: Yes  . Drug use: Never  . Sexual activity: Not on file  Other Topics Concern  . Not on file  Social History Narrative  . Not on file   Social Determinants of Health   Financial Resource Strain:   . Difficulty of Paying Living Expenses: Not on file  Food Insecurity:   . Worried About Charity fundraiser in the Last Year: Not on file  . Ran Out of Food in the  Last Year: Not on file  Transportation Needs:   . Lack of Transportation (Medical): Not on file  . Lack of Transportation (Non-Medical): Not on file  Physical Activity:   . Days of Exercise per Week: Not on file  . Minutes of Exercise per Session: Not on file  Stress:   . Feeling of Stress : Not on file  Social Connections:   . Frequency of Communication with Friends and Family: Not on file  . Frequency of Social Gatherings with Friends and Family: Not on file  . Attends Religious Services: Not on file  . Active Member of Clubs or Organizations: Not on file  . Attends Archivist Meetings: Not on file  . Marital Status: Not on file  Intimate Partner Violence:   . Fear of Current or Ex-Partner: Not on file  . Emotionally Abused: Not on file  . Physically Abused: Not on file  . Sexually Abused: Not on file    Review of Systems: See HPI, otherwise negative ROS  Physical Exam: BP (!) 124/59   Pulse 75   Temp 98.2 F (36.8 C) (Temporal)   Resp 16   Ht 5\' 6"  (1.676 m)   Wt 47.3 kg   SpO2 98%   BMI 16.82 kg/m  General:   Alert,  pleasant  and cooperative in NAD Head:  Normocephalic and atraumatic. Neck:  Supple; no masses or thyromegaly. Lungs:  Clear throughout to auscultation, normal respiratory effort.    Heart:  +S1, +S2, Regular rate and rhythm, No edema. Abdomen:  Soft, nontender and nondistended. Normal bowel sounds, without guarding, and without rebound.   Neurologic:  Alert and  oriented x4;  grossly normal neurologically.  Impression/Plan: Stacy Wells is here for an colonoscopy to be performed for surveillance due to prior history of colon polyps   Risks, benefits, limitations, and alternatives regarding  colonoscopy have been reviewed with the patient.  Questions have been answered.  All parties agreeable.   Jonathon Bellows, MD  10/22/2020, 9:43 AM

## 2020-10-23 ENCOUNTER — Encounter: Payer: Self-pay | Admitting: Gastroenterology

## 2020-10-23 LAB — SURGICAL PATHOLOGY

## 2020-10-24 ENCOUNTER — Encounter: Payer: Self-pay | Admitting: Gastroenterology

## 2020-12-09 ENCOUNTER — Ambulatory Visit: Payer: Commercial Managed Care - PPO | Admitting: Physician Assistant

## 2020-12-25 ENCOUNTER — Encounter: Payer: Self-pay | Admitting: Physician Assistant

## 2020-12-25 ENCOUNTER — Other Ambulatory Visit: Payer: Self-pay

## 2020-12-25 ENCOUNTER — Ambulatory Visit (INDEPENDENT_AMBULATORY_CARE_PROVIDER_SITE_OTHER): Payer: Commercial Managed Care - PPO | Admitting: Physician Assistant

## 2020-12-25 VITALS — BP 134/79 | HR 74 | Temp 98.7°F | Wt 112.8 lb

## 2020-12-25 DIAGNOSIS — Z23 Encounter for immunization: Secondary | ICD-10-CM

## 2020-12-25 DIAGNOSIS — E785 Hyperlipidemia, unspecified: Secondary | ICD-10-CM

## 2020-12-25 DIAGNOSIS — M1990 Unspecified osteoarthritis, unspecified site: Secondary | ICD-10-CM

## 2020-12-25 DIAGNOSIS — R350 Frequency of micturition: Secondary | ICD-10-CM

## 2020-12-25 MED ORDER — OXYBUTYNIN CHLORIDE ER 10 MG PO TB24: 10.0000 mg | ORAL_TABLET | Freq: Every day | ORAL | 1 refills | Status: DC

## 2020-12-25 MED ORDER — ROSUVASTATIN CALCIUM 5 MG PO TABS: 5.0000 mg | ORAL_TABLET | Freq: Every day | ORAL | 3 refills | Status: AC

## 2020-12-25 NOTE — Patient Instructions (Addendum)
Tylenol: 3000 mg daily or 1000 mg three times daily  CoQ10: 100 mg daily if needed for muscle cramping   [facility]

## 2020-12-25 NOTE — Progress Notes (Signed)
Established patient visit   Patient: Stacy Wells   DOB: 1961-01-27   60 y.o. Female  MRN: [id number] Visit Date: 12/25/2020  Today's healthcare provider: Trey Sailors, PA-C   Chief Complaint  Patient presents with  . Hyperlipidemia  I,Stacy Wells M Stacy Wells,acting as a scribe for [employer], PA-C.,have documented all relevant documentation on the behalf of Stacy Sailors, PA-C,as directed by  Stacy Sailors, PA-C while in the presence of Stacy Sailors, PA-C.  Subjective    HPI   In the interim, she has received a colonoscoy  Lipid/Cholesterol, Follow-up  Last lipid panel Other pertinent labs  Lab Results  Component Value Date   CHOL 214 (H) 12/25/2020   HDL 72 12/25/2020   LDLCALC 120 (H) 12/25/2020   TRIG 124 12/25/2020   CHOLHDL 3.0 12/25/2020   Lab Results  Component Value Date   ALT 11 08/20/2020   AST 15 08/20/2020   PLT 300 08/20/2020   TSH 1.340 08/20/2020     She was last seen for this 2 months ago.  Management since that visit includes started rosuvastatin for elevated CVD risk.  She reports good compliance with treatment. She is having side effects. Patient reports lower back and ankle pain. She is having some muscle cramping though it is intermittent and she is not overly bothered by it.   Symptoms: No chest pain No chest pressure/discomfort  No dyspnea No lower extremity edema  No numbness or tingling of extremity No orthopnea  No palpitations No paroxysmal nocturnal dyspnea  No speech difficulty No syncope   Current diet: well balanced Current exercise: housecleaning and no regular exercise  The 10-year ASCVD risk score Stacy Wells., et al., 2013) is: 7.2%  --------------------------------------------------------------------------------------------------- She is having worsening low back pain that happens intermittently. Worse with movement.      Medications: Outpatient Medications Prior to Visit  Medication Sig  .  [DISCONTINUED] oxybutynin (DITROPAN XL) 5 MG 24 hr tablet Take 1 tablet (5 mg total) by mouth at bedtime.  . [DISCONTINUED] rosuvastatin (CRESTOR) 5 MG tablet Take 1 tablet (5 mg total) by mouth daily.   No facility-administered medications prior to visit.    Review of Systems    Objective    BP 134/79 (BP Location: Left Arm, Patient Position: Sitting, Cuff Size: Large)   Pulse 74   Temp 98.7 F (37.1 C) (Oral)   Wt 112 lb 12.8 oz (51.2 kg)   SpO2 100%   BMI 18.21 kg/m    Physical Exam Constitutional:      Appearance: Normal appearance.  Cardiovascular:     Rate and Rhythm: Normal rate and regular rhythm.     Heart sounds: Normal heart sounds.  Pulmonary:     Effort: Pulmonary effort is normal.     Breath sounds: Normal breath sounds.  Musculoskeletal:     Lumbar back: Normal.  Skin:    General: Skin is warm and dry.  Neurological:     Mental Status: She is alert and oriented to person, place, and time. Mental status is at baseline.  Psychiatric:        Mood and Affect: Mood normal.        Behavior: Behavior normal.       Results for orders placed or performed in visit on 12/25/20  Lipid Profile  Result Value Ref Range   Cholesterol, Total 214 (H) 100 - 199 mg/dL   Triglycerides 762 0 - 149 mg/dL  HDL 72 >39 mg/dL   VLDL Cholesterol Cal 22 5 - 40 mg/dL   LDL Chol Calc (NIH) 120 (H) 0 - 99 mg/dL   Chol/HDL Ratio 3.0 0.0 - 4.4 ratio    Assessment & Plan     1. Hyperlipidemia, unspecified hyperlipidemia type  Lower but not to goal. Advised to use COq10 for muscle cramps. Consider increasing dose of crestor if cramping improves.  - Lipid Profile - rosuvastatin (CRESTOR) 5 MG tablet; Take 1 tablet (5 mg total) by mouth daily.  Dispense: 90 tablet; Refill: 3  2. Arthritis  - DG Lumbar Spine Complete; Future  3. Need for shingles vaccine  - Varicella-zoster vaccine IM  4. Urinary frequency  Increase, patient is not experiencing relief at 5 mg daily.    - oxybutynin (DITROPAN XL) 10 MG 24 hr tablet; Take 1 tablet (10 mg total) by mouth at bedtime.  Dispense: 90 tablet; Refill: 1   No follow-ups on file.      Stacy Post, PA-C, have reviewed all documentation for this visit. The documentation on 01/01/21 for the exam, diagnosis, procedures, and orders are all accurate and complete.  The entirety of the information documented in the History of Present Illness, Review of Systems and Physical Exam were personally obtained by me. Portions of this information were initially documented by [facility] and reviewed by me for thoroughness and accuracy.     Stacy Wells  Stacy Wells [phone] (phone) [phone] (fax)  Stacy Wells

## 2020-12-26 LAB — LIPID PANEL
Chol/HDL Ratio: 3 ratio (ref 0.0–4.4)
Cholesterol, Total: 214 mg/dL — ABNORMAL HIGH (ref 100–199)
HDL: 72 mg/dL (ref >39)
LDL Chol Calc (NIH): 120 mg/dL — ABNORMAL HIGH (ref 0–99)
Triglycerides: 124 mg/dL (ref 0–149)
VLDL Cholesterol Cal: 22 mg/dL (ref 5–40)

## 2021-01-02 ENCOUNTER — Telehealth: Payer: Self-pay

## 2021-01-02 ENCOUNTER — Ambulatory Visit: Payer: Self-pay | Admitting: Physician Assistant

## 2021-01-02 NOTE — Telephone Encounter (Signed)
Per note in chart -  ----- Message from Trinna Post, PA-C sent at 01/01/2021 10:47 AM EST ----- Cholesterol improved but not quite at goal. Can consider increasing crestor to 10 mg daily, if she is having muscle cramps however she may just want to stay at the 5 mg dose. If she wants to increase to 10 mg daily can send in crestor 10 mg QHS #90 with 1 refill if she'd like it.    LMOM to return call to office for lab results.

## 2021-01-02 NOTE — Telephone Encounter (Signed)
Pt given lab results per notes of Carles Collet, PA-C on 01/01/21. Pt verbalized understanding. She says she hasn't been taking the Crestor 5 mg every day, she takes it 3-4 times a week. She says she will try to take it every day and see how the leg cramps are. I advised to let us know if she's not able to tolerate taking everyday as prescribed, so Fabio Bering will be aware and make recommendations.

## 2021-01-02 NOTE — Telephone Encounter (Signed)
See TE for today, results given.

## 2021-01-02 NOTE — Telephone Encounter (Signed)
-----   Message from Trinna Post, [place] sent at 01/01/2021 10:47 AM EST ----- Cholesterol improved but not quite at goal. Can consider increasing crestor to 10 mg daily, if she is having muscle cramps however she may just want to stay at the 5 mg dose. If she wants to increase to 10 mg daily can send in crestor 10 mg QHS #90 with 1 refill if she'd like it.

## 2021-01-02 NOTE — Telephone Encounter (Signed)
Called patient and left a vm to return call.If patient calls back okay for PEC to advise of results.

## 2021-01-24 ENCOUNTER — Ambulatory Visit
Admission: RE | Admit: 2021-01-24 | Discharge: 2021-01-24 | Disposition: A | Discharge status: Home / Self Care | Payer: Commercial Managed Care - PPO | Source: Ambulatory Visit | Attending: Physician Assistant | Admitting: Physician Assistant

## 2021-01-24 ENCOUNTER — Other Ambulatory Visit: Payer: Self-pay

## 2021-01-24 DIAGNOSIS — M1990 Unspecified osteoarthritis, unspecified site: Secondary | ICD-10-CM | POA: Insufficient documentation

## 2021-01-28 ENCOUNTER — Telehealth: Payer: Self-pay

## 2021-01-28 NOTE — Telephone Encounter (Signed)
Patient was advised. She would like to know what she needs to do so it want get worse and if its any medication she csn take. Do she needs to be referred to someone? Please advise.

## 2021-01-28 NOTE — Telephone Encounter (Signed)
-----   Message from Trinna Post, [place] sent at 01/28/2021  8:46 AM EST ----- Please let patient know her lumbar spine xray shows mild arthritic changes at multiple levels of her spine.

## 2021-01-29 NOTE — Telephone Encounter (Signed)
Patient was advised and states she will go get tylenol and naproxen from the drug store.

## 2021-01-29 NOTE — Telephone Encounter (Signed)
Tylenol up to 3000 mg daily is the safest medication. She can use anti inflammatories sparingly like ibuprofen or naproxen. Over the counter lidocaine patches or topical voltaren gel can be helpful as well. Activity as she tolerates. Not a whole lot of targeted treatments to prevent it from getting worse. If these interventions do not work, there are more invasive treatments like injections.

## 2021-06-20 ENCOUNTER — Telehealth: Payer: Self-pay | Admitting: Physician Assistant

## 2021-06-20 DIAGNOSIS — R350 Frequency of micturition: Secondary | ICD-10-CM

## 2021-06-20 MED ORDER — OXYBUTYNIN CHLORIDE ER 10 MG PO TB24: 10.0000 mg | ORAL_TABLET | Freq: Every day | ORAL | 0 refills | Status: AC

## 2021-06-20 NOTE — Telephone Encounter (Signed)
Rx sent to pharmacy   

## 2021-06-20 NOTE — Telephone Encounter (Signed)
[facility] faxed refill request for the following medications:  oxybutynin (DITROPAN XL) 10 MG 24 hr tablet   Please advise.

## 2022-04-13 IMAGING — MG DIGITAL SCREENING BILAT W/ TOMO W/ CAD
5 series · 6 of 13 positions shown · non-contrast
Comparison: Previous exam(s).

CLINICAL DATA: Screening.

EXAM:
DIGITAL SCREENING BILATERAL MAMMOGRAM WITH TOMO AND CAD

[L MLO synth-2D]
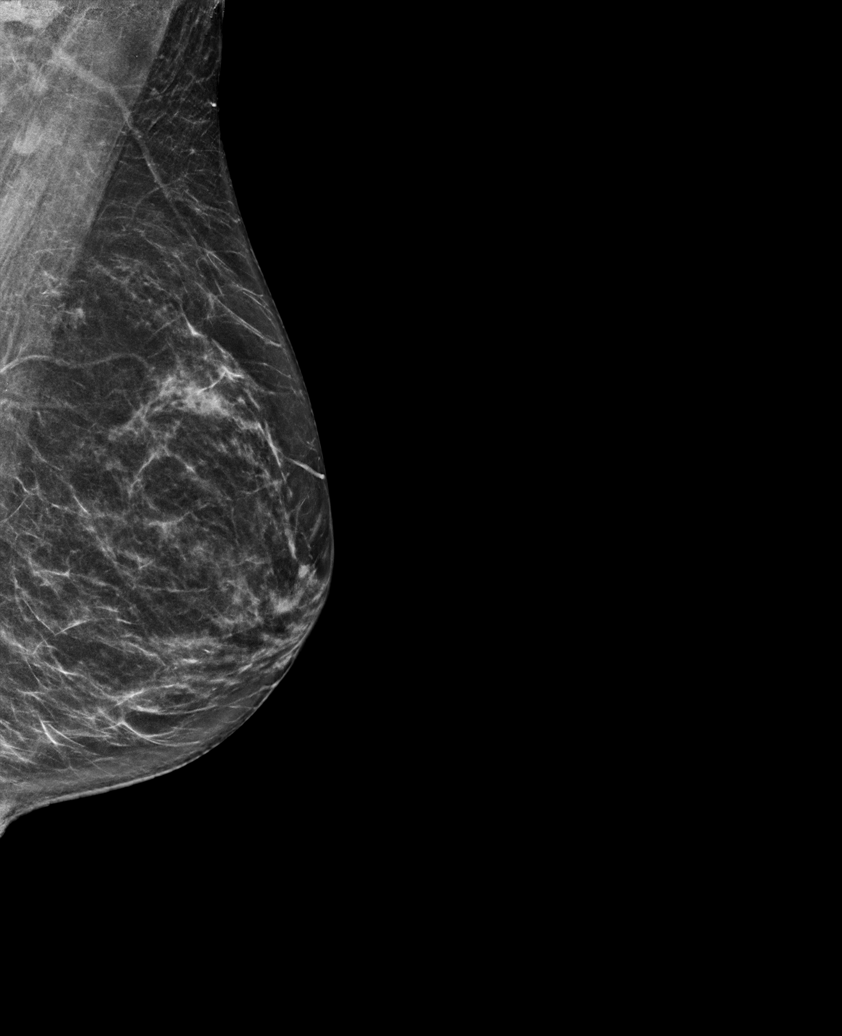

[R CC synth-2D]
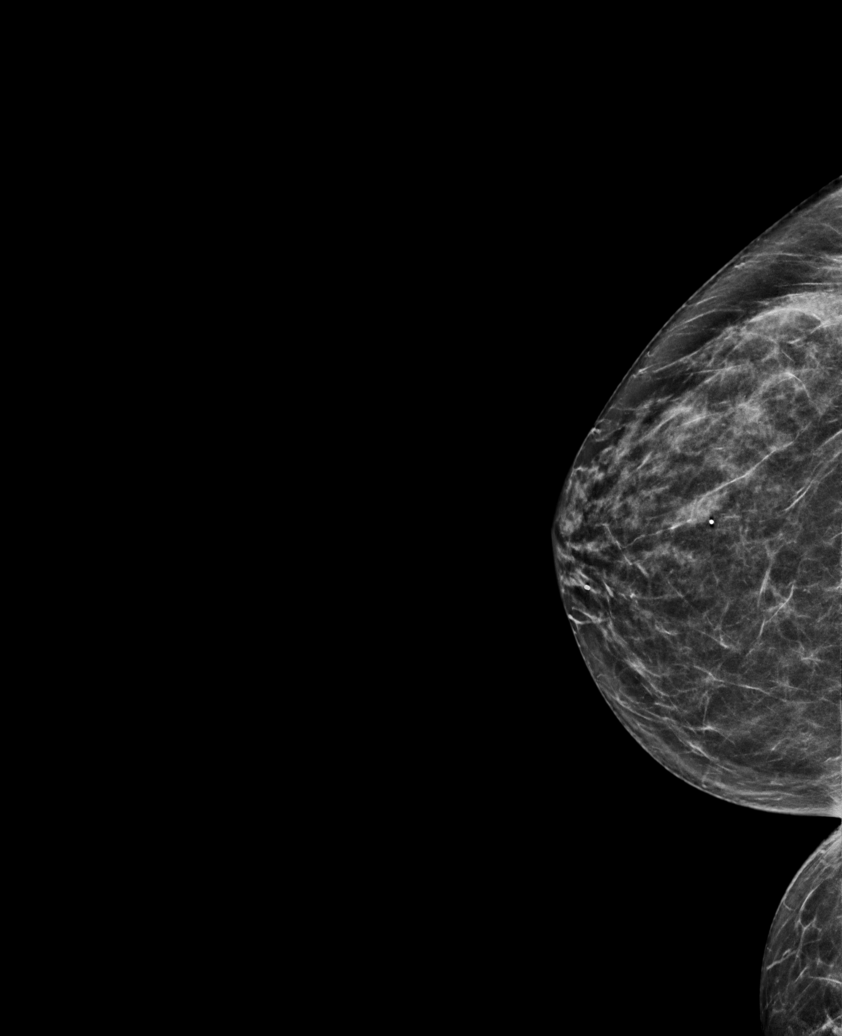

[L CC synth-2D]
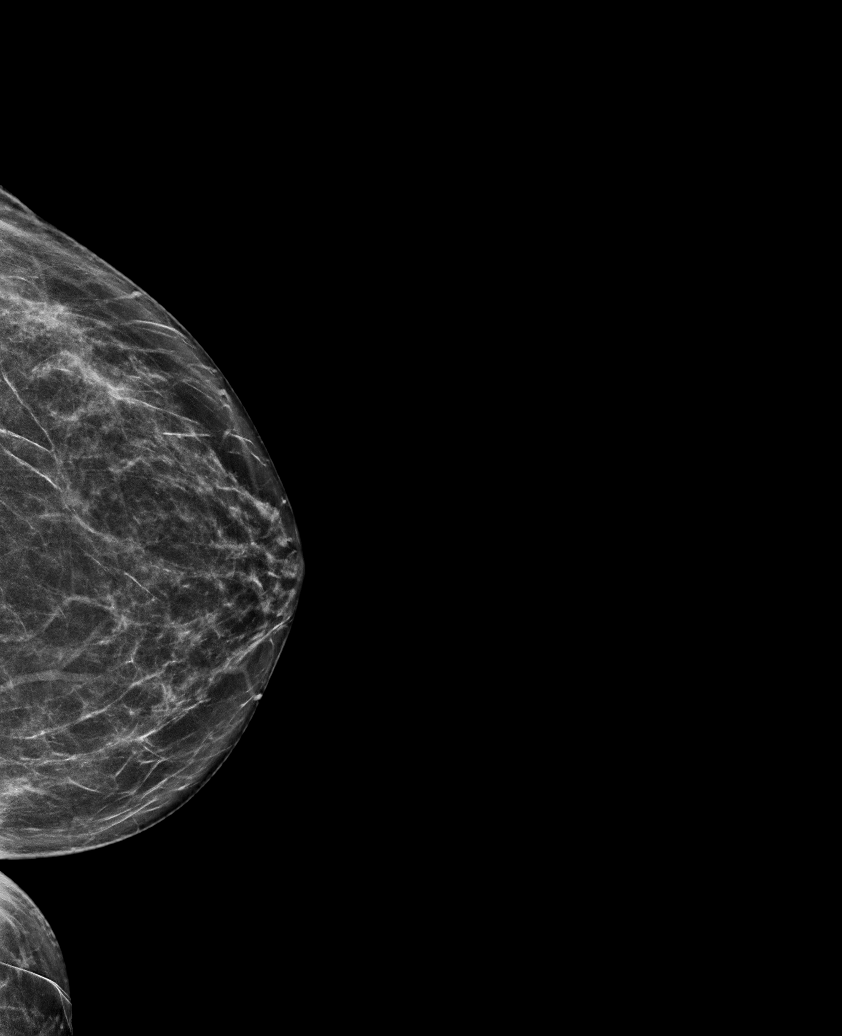

[R CC tomo · 2 of 61 frames shown]
[frame 20/61]
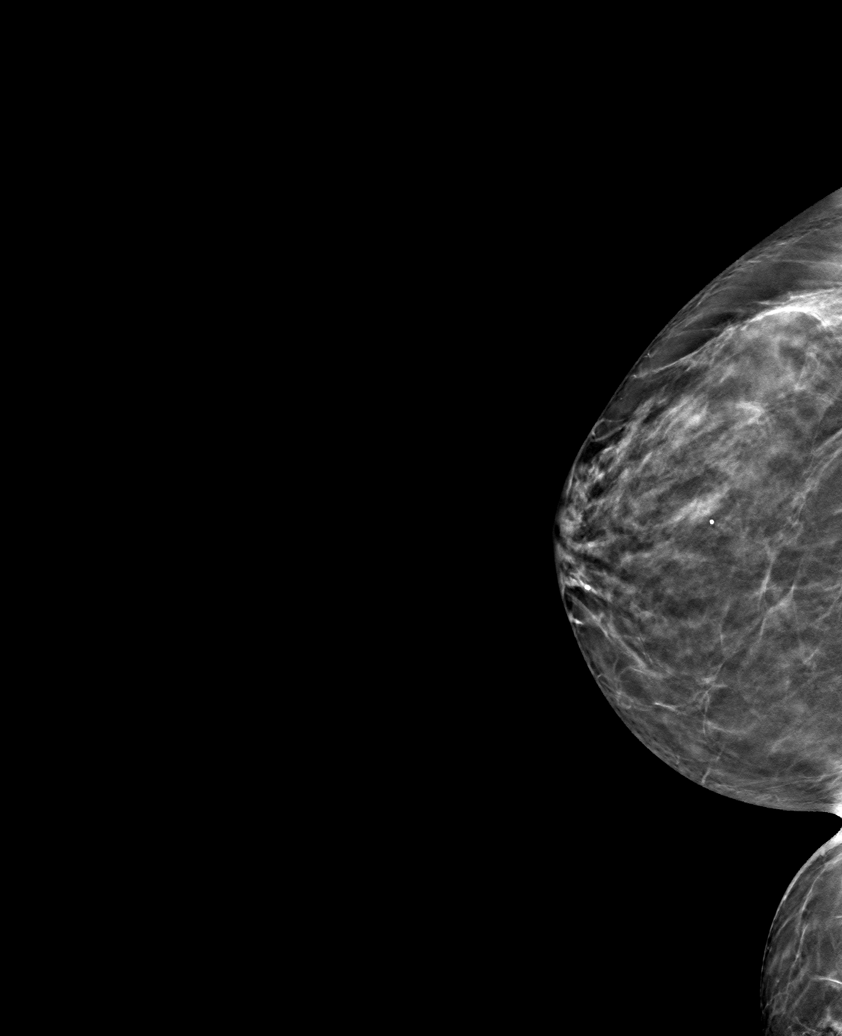
[frame 31/61]
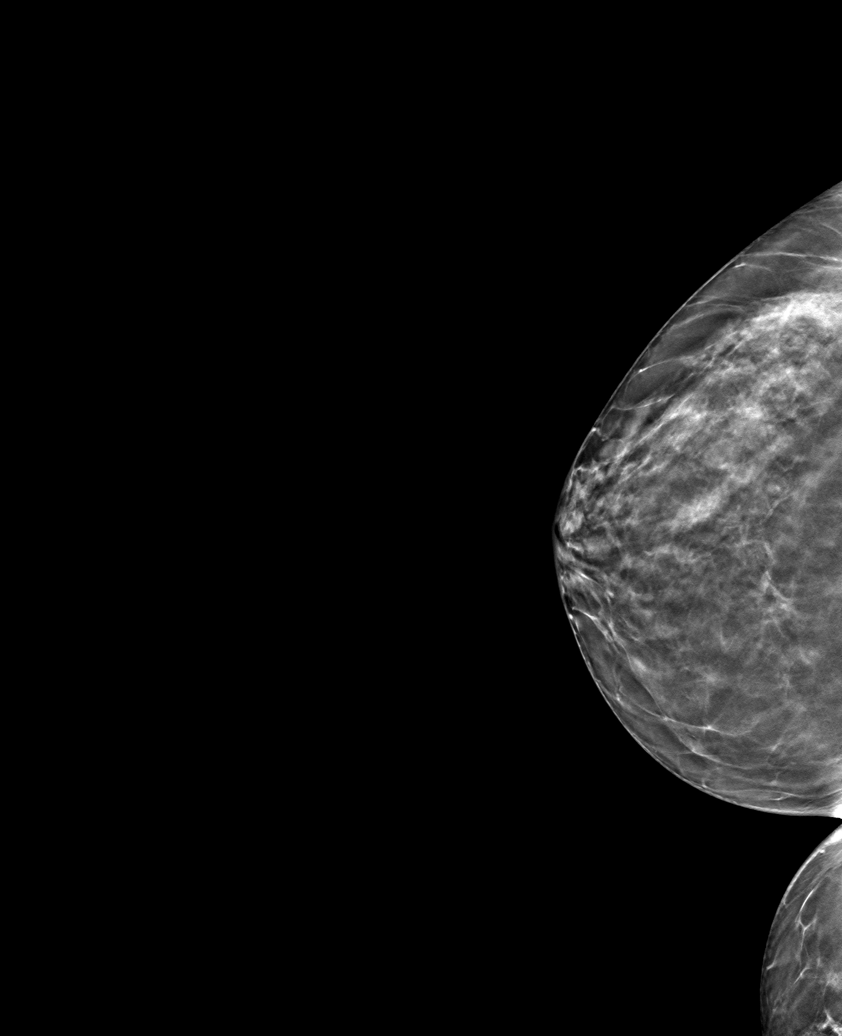

[L CC tomo · tomo slice 28/55.0]
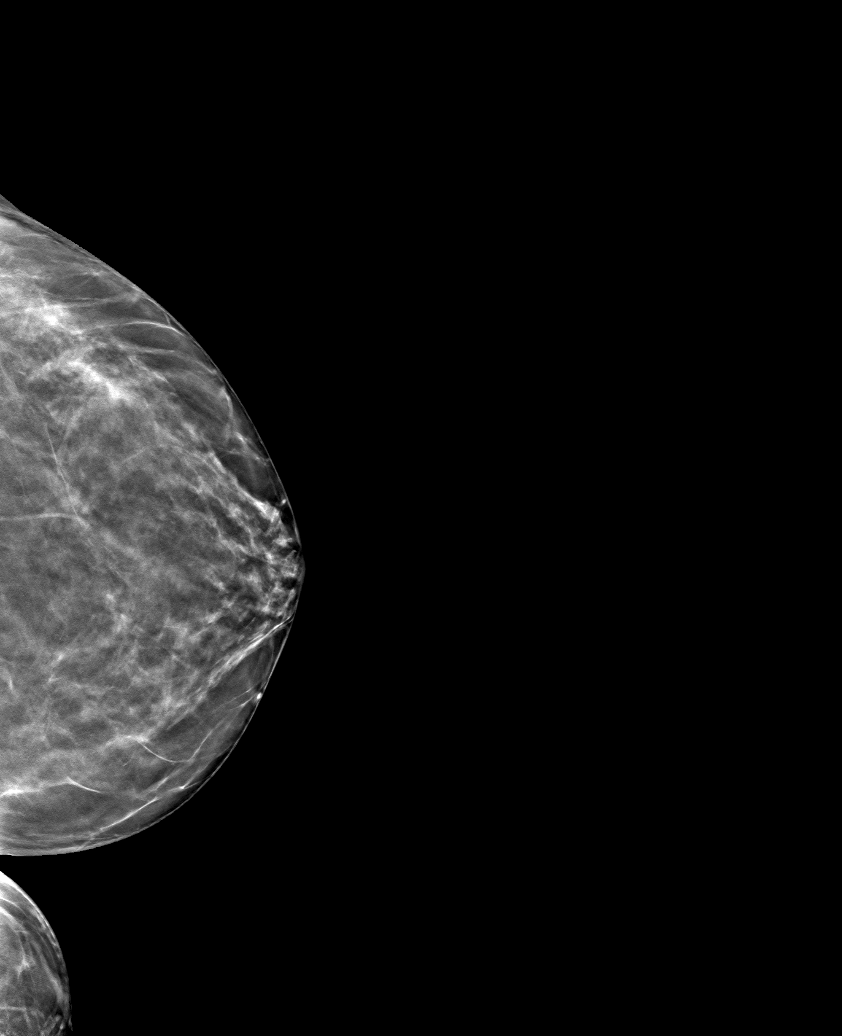

[6 of 13 positions shown; findings below may reference images not displayed]

ACR Breast Density Category c: The breast tissue is heterogeneously
dense, which may obscure small masses.
FINDINGS: There are no findings suspicious for malignancy. Images were
processed with CAD.
IMPRESSION: No mammographic evidence of malignancy. A result letter of this
screening mammogram will be mailed directly to the patient.

RECOMMENDATION:
Screening mammogram in one year. (Code:[id number])

BI-RADS CATEGORY  1: Negative.
